# Patient Record
Sex: Female | Born: 1970 | Race: White | Hispanic: No | Marital: Married | Smoking: Never smoker
Health system: Southern US, Community
[De-identification: ages and names within clinical notes are randomized; demographics above are authoritative.]

## PROBLEM LIST (undated history)

## (undated) DIAGNOSIS — C439 Malignant melanoma of skin, unspecified: Secondary | ICD-10-CM

## (undated) DIAGNOSIS — G43909 Migraine, unspecified, not intractable, without status migrainosus: Secondary | ICD-10-CM

## (undated) DIAGNOSIS — F32A Depression, unspecified: Secondary | ICD-10-CM

---

## 1999-07-05 ENCOUNTER — Other Ambulatory Visit: Admission: RE | Admit: 1999-07-05 | Discharge: 1999-07-05 | Payer: Self-pay | Admitting: Internal Medicine

## 2000-03-29 ENCOUNTER — Inpatient Hospital Stay (HOSPITAL_COMMUNITY): Admission: AD | Admit: 2000-03-29 | Discharge: 2000-04-01 | Payer: Self-pay | Admitting: Obstetrics & Gynecology

## 2000-04-02 ENCOUNTER — Encounter: Admission: RE | Admit: 2000-04-02 | Discharge: 2000-05-09 | Payer: Self-pay | Admitting: Obstetrics and Gynecology

## 2000-05-03 ENCOUNTER — Other Ambulatory Visit: Admission: RE | Admit: 2000-05-03 | Discharge: 2000-05-03 | Payer: Self-pay | Admitting: Obstetrics & Gynecology

## 2001-01-02 ENCOUNTER — Emergency Department (HOSPITAL_COMMUNITY): Admission: EM | Admit: 2001-01-02 | Discharge: 2001-01-02 | Payer: Self-pay | Admitting: Emergency Medicine

## 2001-01-02 ENCOUNTER — Encounter: Payer: Self-pay | Admitting: Emergency Medicine

## 2001-07-13 ENCOUNTER — Inpatient Hospital Stay (HOSPITAL_COMMUNITY): Admission: AD | Admit: 2001-07-13 | Discharge: 2001-07-15 | Payer: Self-pay | Admitting: Obstetrics and Gynecology

## 2001-08-09 ENCOUNTER — Other Ambulatory Visit: Admission: RE | Admit: 2001-08-09 | Discharge: 2001-08-09 | Payer: Self-pay | Admitting: Obstetrics and Gynecology

## 2002-08-13 ENCOUNTER — Ambulatory Visit (HOSPITAL_COMMUNITY): Admission: RE | Admit: 2002-08-13 | Discharge: 2002-08-13 | Payer: Self-pay | Admitting: Obstetrics and Gynecology

## 2002-09-11 ENCOUNTER — Other Ambulatory Visit: Admission: RE | Admit: 2002-09-11 | Discharge: 2002-09-11 | Payer: Self-pay | Admitting: Obstetrics and Gynecology

## 2003-09-18 ENCOUNTER — Other Ambulatory Visit: Admission: RE | Admit: 2003-09-18 | Discharge: 2003-09-18 | Payer: Self-pay | Admitting: Obstetrics and Gynecology

## 2004-09-20 ENCOUNTER — Other Ambulatory Visit: Admission: RE | Admit: 2004-09-20 | Discharge: 2004-09-20 | Payer: Self-pay | Admitting: Obstetrics and Gynecology

## 2004-09-22 ENCOUNTER — Encounter: Admission: RE | Admit: 2004-09-22 | Discharge: 2004-09-22 | Payer: Self-pay | Admitting: Obstetrics and Gynecology

## 2005-11-09 ENCOUNTER — Other Ambulatory Visit: Admission: RE | Admit: 2005-11-09 | Discharge: 2005-11-09 | Payer: Self-pay | Admitting: Obstetrics and Gynecology

## 2006-07-06 ENCOUNTER — Encounter: Admission: RE | Admit: 2006-07-06 | Discharge: 2006-07-06 | Payer: Self-pay | Admitting: Obstetrics and Gynecology

## 2007-06-23 IMAGING — MG MM DIAGNOSTIC BILATERAL
5 series · 5 of 5 positions shown · non-contrast
Comparison: 07/11/04.

DG DIAGNOSTIC BILATERAL
Bilateral CC and MLO view(s) were taken.

RIGHT BREAST ULTRASOUND
Technologist: [employer], Medical
DIGITAL BILATERAL DIAGNOSTIC MAMMOGRAM AND RIGHT BREAST ULTRASOUND:
CLINICAL DATA: Palpable tender nodule in the right upper outer quadrant.

[R CC]
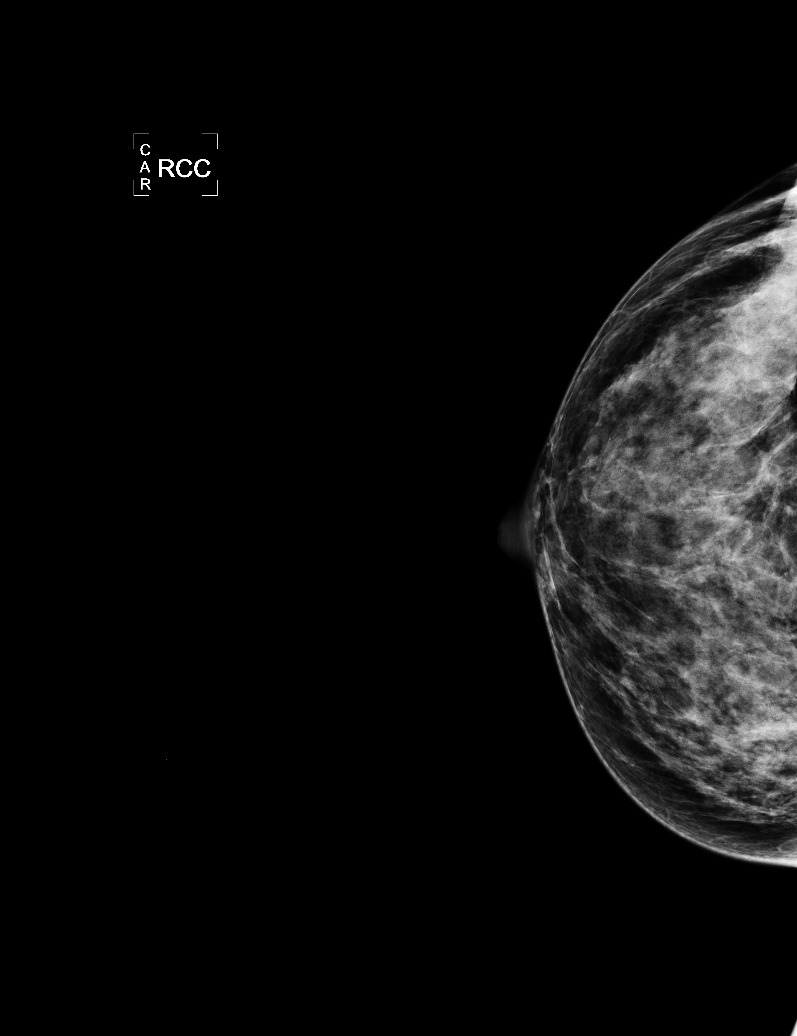

[L CC]
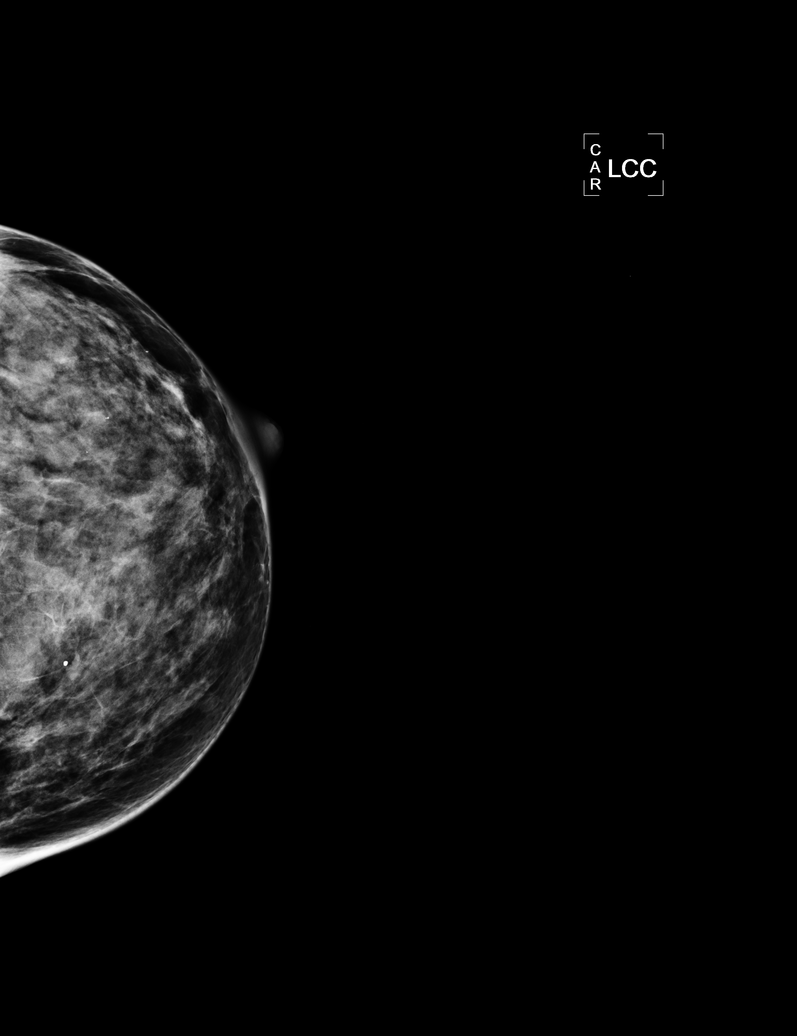

[L MLO]
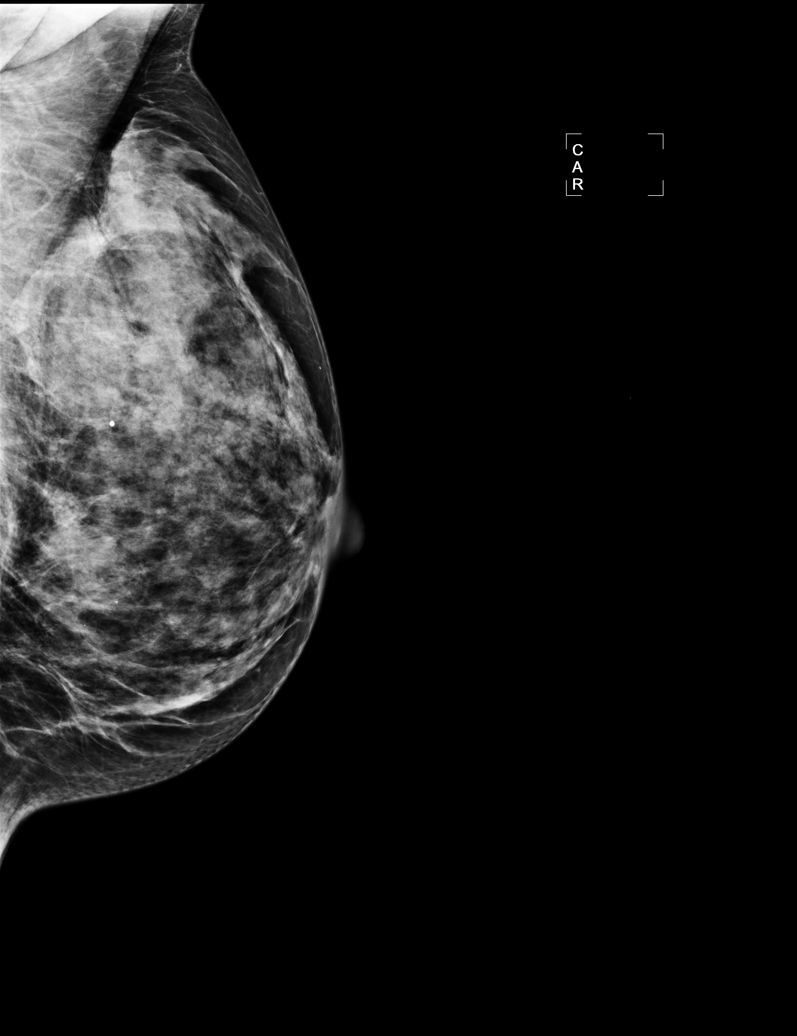

[R MLO]
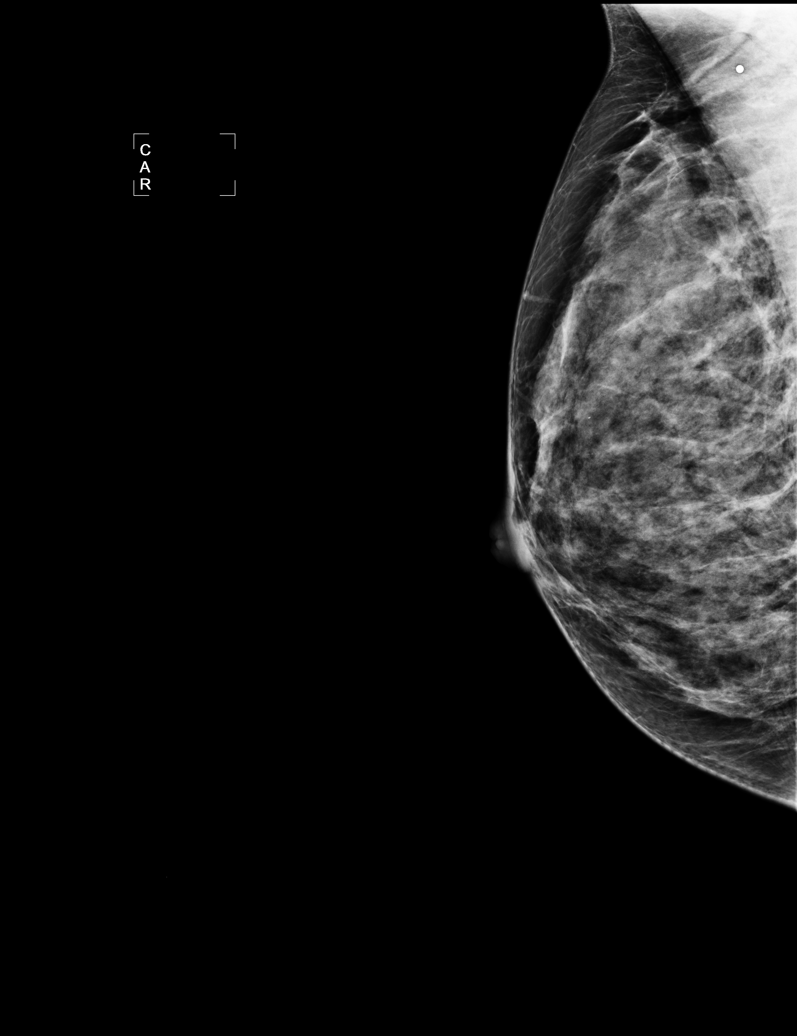

[R TAN]
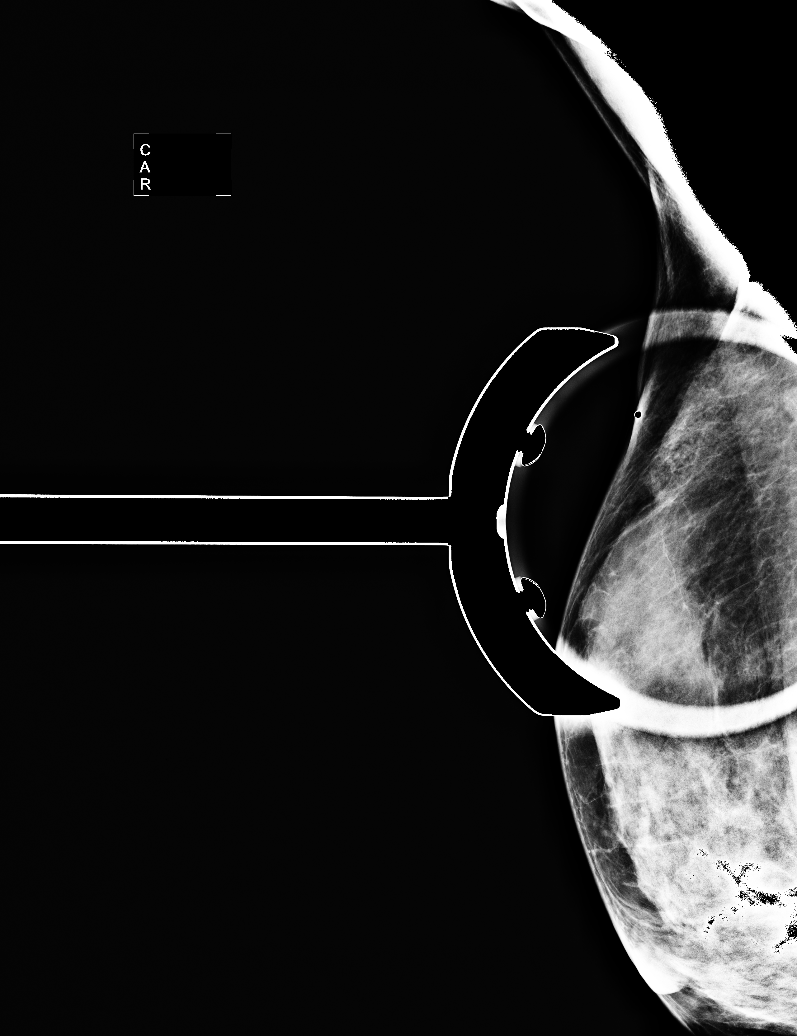

[5 of 5 positions shown; findings below may reference images not displayed]

The dense fibroglandular tissue is symmetric and stable.  No dominant masses, suspicious 
calcifications, or areas of architectural distortion are seen bilaterally.

Ultrasound of the right upper outer quadrant near the axilla in the patient's region of palpable 
concern and tenderness reveals normal intramammary lymph nodes only.  No suspicious solid masses, 
shadowing, or adenopathy are seen.
IMPRESSION: Normal intramammary lymph nodes in the right upper outer quadrant.  There is no specific 
radiographic evidence of malignancy bilaterally.  Screening mammography at age 40 is recommended, 
unless otherwise clinically indicated.

ASSESSMENT: Benign - BI-RADS 2

Routine screening mammogram at age 39.
,

## 2010-10-31 ENCOUNTER — Encounter: Payer: Self-pay | Admitting: Obstetrics and Gynecology

## 2011-06-15 ENCOUNTER — Other Ambulatory Visit: Payer: Self-pay | Admitting: Obstetrics and Gynecology

## 2011-08-16 ENCOUNTER — Ambulatory Visit: Admit: 2011-08-16 | Payer: Self-pay | Admitting: Obstetrics and Gynecology

## 2011-08-16 SURGERY — NOVASURE ABLATION
Anesthesia: Choice

## 2022-09-07 ENCOUNTER — Encounter (HOSPITAL_BASED_OUTPATIENT_CLINIC_OR_DEPARTMENT_OTHER): Payer: Self-pay | Admitting: Urology

## 2022-09-07 ENCOUNTER — Emergency Department (HOSPITAL_BASED_OUTPATIENT_CLINIC_OR_DEPARTMENT_OTHER): Payer: 59

## 2022-09-07 ENCOUNTER — Emergency Department (HOSPITAL_BASED_OUTPATIENT_CLINIC_OR_DEPARTMENT_OTHER)
Admission: EM | Admit: 2022-09-07 | Discharge: 2022-09-08 | Disposition: A | Discharge status: Home / Self Care | Payer: 59 | Attending: Emergency Medicine | Admitting: Emergency Medicine

## 2022-09-07 ENCOUNTER — Other Ambulatory Visit: Payer: Self-pay

## 2022-09-07 DIAGNOSIS — I4510 Unspecified right bundle-branch block: Secondary | ICD-10-CM | POA: Diagnosis not present

## 2022-09-07 DIAGNOSIS — R079 Chest pain, unspecified: Secondary | ICD-10-CM | POA: Diagnosis not present

## 2022-09-07 DIAGNOSIS — Z85820 Personal history of malignant melanoma of skin: Secondary | ICD-10-CM | POA: Insufficient documentation

## 2022-09-07 DIAGNOSIS — R110 Nausea: Secondary | ICD-10-CM | POA: Insufficient documentation

## 2022-09-07 HISTORY — DX: Depression, unspecified: F32.A

## 2022-09-07 HISTORY — DX: Malignant melanoma of skin, unspecified: C43.9

## 2022-09-07 HISTORY — DX: Migraine, unspecified, not intractable, without status migrainosus: G43.909

## 2022-09-07 LAB — CBC
HCT: 39.7 % (ref 36.0–46.0)
Hemoglobin: 13.1 g/dL (ref 12.0–15.0)
MCH: 29.5 pg (ref 26.0–34.0)
MCHC: 33 g/dL (ref 30.0–36.0)
MCV: 89.4 fL (ref 80.0–100.0)
Platelets: 268 10*3/uL (ref 150–400)
RBC: 4.44 MIL/uL (ref 3.87–5.11)
RDW: 12 % (ref 11.5–15.5)
WBC: 5.7 10*3/uL (ref 4.0–10.5)
nRBC: 0 % (ref 0.0–0.2)

## 2022-09-07 LAB — BASIC METABOLIC PANEL
Anion gap: 9 (ref 5–15)
BUN: 14 mg/dL (ref 6–20)
CO2: 28 mmol/L (ref 22–32)
Calcium: 9.2 mg/dL (ref 8.9–10.3)
Chloride: 103 mmol/L (ref 98–111)
Creatinine, Ser: 0.74 mg/dL (ref 0.44–1.00)
GFR, Estimated: >60 mL/min (ref >60)
Glucose, Bld: 101 mg/dL — ABNORMAL HIGH (ref 70–99)
Potassium: 3.7 mmol/L (ref 3.5–5.1)
Sodium: 140 mmol/L (ref 135–145)

## 2022-09-07 LAB — TROPONIN I (HIGH SENSITIVITY): Troponin I (High Sensitivity): 2 ng/L (ref <18)

## 2022-09-07 NOTE — ED Provider Notes (Signed)
[facility] Provider Note: Georgena Spurling, MD, FACEP  CSN: [id number] MRN: [id number] ARRIVAL: 09/07/22 at 2138 ROOM: [facility]  Chest Pain   HISTORY OF PRESENT ILLNESS  09/07/22 10:55 PM Zoe Tate is a 51 y.o. female who was sitting at her computer about 8 PM when she felt the onset of chest pain.  There was a sharp component in the left upper chest as well as a pressure in her center upper chest.  Pain was moderate and worse with deep breathing.  She did feel somewhat short of breath and mildly nauseated but did not vomit and did not have diaphoresis.  She was also having some palpitations which she describes as fluttering.  The pain did not radiate to her arm or neck.  It pretty much resolved after about 15 minutes but left her with a bitemporal headache which she still has.  She also felt "not like myself" after the episode of chest pain but has improved since.  She has had no recent travel.  She does not smoke.  She has no history of hypertension or hyperlipidemia.  She is not on estrogen.   Past Medical History:  Diagnosis Date   Depression    Melanoma ([place])    Migraine     History reviewed. No pertinent surgical history.  History reviewed. No pertinent family history.  Social History   Tobacco Use   Smoking status: Never   Smokeless tobacco: Never  Substance Use Topics   Alcohol use: Yes    Comment: occ   Drug use: Never    Prior to Admission medications   Not on File    Allergies Patient has no known allergies.   REVIEW OF SYSTEMS  Negative except as noted here or in the History of Present Illness.   PHYSICAL EXAMINATION  Initial Vital Signs Blood pressure (!) 158/102, pulse 74, temperature 97.6 F (36.4 C), temperature source Oral, resp. rate 18, height '5\' 7"'$  (1.702 m), weight 65.3 kg, SpO2 100 %.  Examination General: Well-developed, well-nourished female in no acute distress; appearance consistent with age of  record HENT: normocephalic; atraumatic Eyes: Normal appearance Neck: supple Heart: regular rate and rhythm; rare PVCs Lungs: clear to auscultation bilaterally Chest: Nontender Abdomen: soft; nondistended; nontender; bowel sounds present Extremities: No deformity; full range of motion; pulses normal; no edema Neurologic: Awake, alert and oriented; motor function intact in all extremities and symmetric; no facial droop Skin: Warm and dry Psychiatric: Normal mood and affect   RESULTS  Summary of this visit's results, reviewed and interpreted by myself:   EKG Interpretation  Date/Time:  Wednesday September 07 2022 21:50:46 EST Ventricular Rate:  86 PR Interval:  132 QRS Duration: 100 QT Interval:  378 QTC Calculation: 452 R Axis:   82 Text Interpretation: Normal sinus rhythm Incomplete right bundle branch block Borderline ECG No previous ECGs available Confirmed by Crissa Sowder, Jenny Reichmann [phone]) on 09/07/2022 10:45:02 PM       Laboratory Studies: Results for orders placed or performed during the hospital encounter of 09/07/22 (from the past 24 hour(s))  Basic metabolic panel     Status: Abnormal   Collection Time: 09/07/22  9:54 PM  Result Value Ref Range   Sodium 140 135 - 145 mmol/L   Potassium 3.7 3.5 - 5.1 mmol/L   Chloride 103 98 - 111 mmol/L   CO2 28 22 - 32 mmol/L   Glucose, Bld 101 (H) 70 - 99 mg/dL   BUN 14  6 - 20 mg/dL   Creatinine, Ser 0.74 0.44 - 1.00 mg/dL   Calcium 9.2 8.9 - 10.3 mg/dL   GFR, Estimated >60 >60 mL/min   Anion gap 9 5 - 15  CBC     Status: None   Collection Time: 09/07/22  9:54 PM  Result Value Ref Range   WBC 5.7 4.0 - 10.5 K/uL   RBC 4.44 3.87 - 5.11 MIL/uL   Hemoglobin 13.1 12.0 - 15.0 g/dL   HCT 39.7 36.0 - 46.0 %   MCV 89.4 80.0 - 100.0 fL   MCH 29.5 26.0 - 34.0 pg   MCHC 33.0 30.0 - 36.0 g/dL   RDW 12.0 11.5 - 15.5 %   Platelets 268 150 - 400 K/uL   nRBC 0.0 0.0 - 0.2 %  Troponin I (High Sensitivity)     Status: None   Collection Time:  09/07/22  9:54 PM  Result Value Ref Range   Troponin I (High Sensitivity) 2 <18 ng/L   Imaging Studies: DG Chest 2 View  Result Date: 09/07/2022 CLINICAL DATA:  Chest pain EXAM: CHEST - 2 VIEW COMPARISON:  None Available. FINDINGS: The heart size and mediastinal contours are within normal limits. Both lungs are clear. The visualized skeletal structures are unremarkable. IMPRESSION: No active cardiopulmonary disease. Electronically Signed   By: Placido Sou M.D.   On: 09/07/2022 22:11    ED COURSE and MDM  Nursing notes, initial and subsequent vitals signs, including pulse oximetry, reviewed and interpreted by myself.  Vitals:   09/07/22 2144 09/07/22 2144  BP:  (!) 158/102  Pulse:  74  Resp:  18  Temp:  97.6 F (36.4 C)  TempSrc:  Oral  SpO2:  100%  Weight: 65.3 kg   Height: '5\' 7"'$  (1.702 m)    Medications - No data to display    PROCEDURES  Procedures   ED DIAGNOSES  No diagnosis found.

## 2022-09-07 NOTE — ED Notes (Signed)
Pt provided water, per her request.  EDP Molpus aware and agreeable.

## 2022-09-07 NOTE — ED Triage Notes (Signed)
Pt reports central chest pain that started approx 1.5 hr PTA  Denies any N/V, mild SOB  No radiation of pain  No previous cardiac history

## 2022-09-08 LAB — TROPONIN I (HIGH SENSITIVITY): Troponin I (High Sensitivity): 2 ng/L (ref <18)

## 2022-09-08 NOTE — ED Notes (Signed)
D/c paperwork reviewed with pt, including f/u care. Pt verbalized understanding, no questions or concerns at time of d/c. Ambulatory to ED exit without assistance, NAD.

## 2022-10-13 ENCOUNTER — Encounter (HOSPITAL_BASED_OUTPATIENT_CLINIC_OR_DEPARTMENT_OTHER): Payer: Self-pay | Admitting: Cardiology

## 2022-10-13 ENCOUNTER — Ambulatory Visit (HOSPITAL_BASED_OUTPATIENT_CLINIC_OR_DEPARTMENT_OTHER): Payer: 59 | Admitting: Cardiology

## 2022-10-13 VITALS — BP 126/90 | HR 82 | Ht 66.5 in | Wt 147.8 lb

## 2022-10-13 DIAGNOSIS — R0789 Other chest pain: Secondary | ICD-10-CM | POA: Diagnosis not present

## 2022-10-13 DIAGNOSIS — Z7189 Other specified counseling: Secondary | ICD-10-CM | POA: Diagnosis not present

## 2022-10-13 DIAGNOSIS — Z712 Person consulting for explanation of examination or test findings: Secondary | ICD-10-CM

## 2022-10-13 DIAGNOSIS — R079 Chest pain, unspecified: Secondary | ICD-10-CM

## 2022-10-13 NOTE — Patient Instructions (Signed)
Medication Instructions:  Your physician recommends that you continue on your current medications as directed. Please refer to the Current Medication list given to you today.   *If you need a refill on your cardiac medications before your next appointment, please call your pharmacy*  Lab Work: NONE  Testing/Procedures: NONE  Follow-Up: AS NEEDED

## 2022-10-13 NOTE — Progress Notes (Signed)
Cardiology Office Note:    Date:  10/13/2022   ID:  Zoe Tate, DOB 1971-09-11, MRN [id number]  PCP:  Robyne Peers, MD  Cardiologist:  Buford Dresser, MD  Referring MD: Shanon Rosser, MD   CC: New patient evaluation for chest pain   History of Present Illness:    Zoe Tate is a 52 y.o. female with a hx of melanoma, and depression, who is seen as a new consult at the request of Molpus, John, MD for the evaluation and management of chest pain.  On 09/07/2022 she presented to the ED following a 15 minute episode of sharp chest pain with fluttering palpitations while sitting at her computer. Subsequently developed a bitemporal headache. No arrhythmias or ectopy seen on heart monitor. She was discharged in stable condition and referred for outpatient cardiology follow-up.  Chest pain: -Initial onset: 09/07/22.  -Quality: Pressure like someone sitting on her chest, with shooting pains. -Frequency: Isolated episode, has not recurred. -Duration: About 15 minutes. -Associated symptoms: While sitting and typing, her apple watch frequently alerts her to fast heart rates, including over 100 bpm. At this point, she denies feeling limited by her symptoms. -Aggravating/alleviating factors: She notes that she has had life stressors; she lost her husband last January. -Prior cardiac history:  At one time she was told that she had a small heart murmur. -Prior ECG: On 09/07/22 ECG showed NSR at 86 bpm, Incomplete RBBB. -Prior workup: None -Prior treatment: None -Alcohol:  Always has been a social drinker, weekends and holidays. After her husband passed, she stopped drinking for about 6 months. Currently, on most days she will have a small espresso martini. If socially, usually not more than 2 drinks. -Tobacco: Never. -Comorbidities:  -Exercise level: For work she sits at a desk while working from home. She will try to get up for breaks periodically, and use workout equipment at  home. She walks daily with her dog. She is following the mediterranean diet and has lost some weight.  -Cardiac ROS: no shortness of breath, no PND, no orthopnea, no LE edema, no syncope -Family history:  She believes her biological father died of a heart attack.   Past Medical History:  Diagnosis Date   Depression    Melanoma ([place])    Migraine     No past surgical history on file.  Current Medications: Current Outpatient Medications on File Prior to Visit  Medication Sig   escitalopram (LEXAPRO) 20 MG tablet Take 20 mg by mouth daily.   imipramine (TOFRANIL) 25 MG tablet Take 75 mg by mouth at bedtime.   No current facility-administered medications on file prior to visit.     Allergies:   Patient has no known allergies.   Social History   Tobacco Use   Smoking status: Never   Smokeless tobacco: Never  Substance Use Topics   Alcohol use: Yes    Comment: occ   Drug use: Never    Family History: She believes her biological father died of a heart attack.  ROS:   Please see the history of present illness.  Additional pertinent ROS: Constitutional: Negative for chills, fever, unintentional weight loss. Positive for night sweats.  HENT: Negative for ear pain and hearing loss.   Eyes: Negative for loss of vision and eye pain.  Respiratory: Negative for cough, sputum, wheezing.   Cardiovascular: See HPI. Gastrointestinal: Negative for abdominal pain, melena, and hematochezia.  Genitourinary: Negative for dysuria and hematuria.  Musculoskeletal: Negative for falls and myalgias.  Skin: Negative for itching and rash.  Neurological: Negative for focal weakness, focal sensory changes and loss of consciousness.  Endo/Heme/Allergies: Does not bruise/bleed easily.     EKGs/Labs/Other Studies Reviewed:    The following studies were reviewed today:  Chest X-ray  09/07/2022: FINDINGS: The heart size and mediastinal contours are within normal limits. Both lungs are clear. The  visualized skeletal structures are unremarkable.   IMPRESSION: No active cardiopulmonary disease.   EKG:  EKG is personally reviewed.   10/13/2022:  NSR at 82 bpm, iRBBB  Recent Labs: 09/07/2022: BUN 14; Creatinine, Ser 0.74; Hemoglobin 13.1; Platelets 268; Potassium 3.7; Sodium 140   Recent Lipid Panel No results found for: "CHOL", "TRIG", "HDL", "CHOLHDL", "VLDL", "LDLCALC", "LDLDIRECT"  Physical Exam:    VS:  BP (!) 126/90 (BP Location: Right Arm, Patient Position: Sitting, Cuff Size: Normal)   Pulse 82   Ht 5' 6.5" (1.689 m)   Wt 147 lb 12.8 oz (67 kg)   BMI 23.50 kg/m     Wt Readings from Last 3 Encounters:  10/13/22 147 lb 12.8 oz (67 kg)  09/07/22 144 lb (65.3 kg)    GEN: Well nourished, well developed in no acute distress HEENT: Normal, moist mucous membranes NECK: No JVD CARDIAC: regular rhythm, normal S1 and S2, no rubs or gallops. No murmur. VASCULAR: Radial and DP pulses 2+ bilaterally. No carotid bruits RESPIRATORY:  Clear to auscultation without rales, wheezing or rhonchi  ABDOMEN: Soft, non-tender, non-distended MUSCULOSKELETAL:  Ambulates independently SKIN: Warm and dry, no edema NEUROLOGIC:  Alert and oriented x 3. No focal neuro deficits noted. PSYCHIATRIC:  Normal affect    ASSESSMENT:    1. Other chest pain   2. Cardiac risk counseling   3. Counseling on health promotion and disease prevention   4. Encounter to discuss test results    PLAN:    Chest pain -reviewed her ER test results at length, reassuring -has only had the single episode as noted -reviewed multiple possible etiologies  -reviewed red flag warning signs that need immediate medical attention -if she has intermittent episodes that are mild/minor, she will contact me and we will discuss additional testing  Cardiac risk counseling and prevention recommendations: -recommend heart healthy/Mediterranean diet, with whole grains, fruits, vegetable, fish, lean meats, nuts, and olive  oil. Limit salt. -recommend moderate walking, 3-5 times/week for 30-50 minutes each session. Aim for at least 150 minutes.week. Goal should be pace of 3 miles/hours, or walking 1.5 miles in 30 minutes -recommend avoidance of tobacco products. Avoid excess alcohol. -ASCVD risk score: The 10-year ASCVD risk score (Arnett DK, et al., 2019) is: 1.8%   Values used to calculate the score:     Age: 41 years     Sex: Female     Is Non-Hispanic African American: No     Diabetic: No     Tobacco smoker: No     Systolic Blood Pressure: 123XX123 mmHg     Is BP treated: No     HDL Cholesterol: 45 MG/DL     Total Cholesterol: 198 MG/DL    Plan for follow up: As needed.  Buford Dresser, MD, PhD, [facility]    Medication Adjustments/Labs and Tests Ordered: Current medicines are reviewed at length with the patient today.  Concerns regarding medicines are outlined above.   Orders Placed This Encounter  Procedures   EKG 12-Lead   No orders of the defined types were placed in this encounter.  Patient Instructions  Medication Instructions:  Your physician recommends that you continue on your current medications as directed. Please refer to the Current Medication list given to you today.   *If you need a refill on your cardiac medications before your next appointment, please call your pharmacy*  Lab Work: NONE  Testing/Procedures: NONE  Follow-Up: AS NEEDED      I,Mathew Stumpf,acting as a Education administrator for [employer], MD.,have documented all relevant documentation on the behalf of Buford Dresser, MD,as directed by  Buford Dresser, MD while in the presence of Buford Dresser, MD.  I, Buford Dresser, MD, have reviewed all documentation for this visit. The documentation on 11/27/22 for the exam, diagnosis, procedures, and orders are all accurate and complete.   Signed, Buford Dresser, MD PhD 10/13/2022     [facility]

## 2022-11-27 ENCOUNTER — Encounter (HOSPITAL_BASED_OUTPATIENT_CLINIC_OR_DEPARTMENT_OTHER): Payer: Self-pay | Admitting: Cardiology

## 2023-07-23 ENCOUNTER — Encounter (HOSPITAL_BASED_OUTPATIENT_CLINIC_OR_DEPARTMENT_OTHER): Payer: Self-pay | Admitting: Emergency Medicine

## 2023-07-23 ENCOUNTER — Emergency Department (HOSPITAL_BASED_OUTPATIENT_CLINIC_OR_DEPARTMENT_OTHER): Payer: No Typology Code available for payment source

## 2023-07-23 ENCOUNTER — Other Ambulatory Visit: Payer: Self-pay

## 2023-07-23 ENCOUNTER — Emergency Department (HOSPITAL_BASED_OUTPATIENT_CLINIC_OR_DEPARTMENT_OTHER)
Admission: EM | Admit: 2023-07-23 | Discharge: 2023-07-23 | Disposition: A | Discharge status: Home / Self Care | Payer: No Typology Code available for payment source | Attending: Emergency Medicine | Admitting: Emergency Medicine

## 2023-07-23 DIAGNOSIS — Y92410 Unspecified street and highway as the place of occurrence of the external cause: Secondary | ICD-10-CM | POA: Diagnosis not present

## 2023-07-23 DIAGNOSIS — G44209 Tension-type headache, unspecified, not intractable: Secondary | ICD-10-CM | POA: Diagnosis not present

## 2023-07-23 DIAGNOSIS — T148XXA Other injury of unspecified body region, initial encounter: Secondary | ICD-10-CM

## 2023-07-23 DIAGNOSIS — S161XXA Strain of muscle, fascia and tendon at neck level, initial encounter: Secondary | ICD-10-CM | POA: Diagnosis not present

## 2023-07-23 DIAGNOSIS — M542 Cervicalgia: Secondary | ICD-10-CM | POA: Diagnosis present

## 2023-07-23 MED ORDER — CYCLOBENZAPRINE HCL 10 MG PO TABS: 5.0000 mg | ORAL_TABLET | Freq: Every day | ORAL | 0 refills | Status: AC

## 2023-07-23 MED ORDER — ACETAMINOPHEN 500 MG PO TABS
1000.0000 mg | ORAL_TABLET | Freq: Once | ORAL | Status: AC
  Administered 2023-07-23: 1000 mg via ORAL
  Filled 2023-07-23: qty 2
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
## 2023-12-08 ENCOUNTER — Ambulatory Visit: Payer: Self-pay | Admitting: Family Medicine

## 2023-12-08 NOTE — Telephone Encounter (Signed)
 Summary: Headache   Copied From CRM [id number]. Reason for Triage: Patient complains of Headache at 5/10.

## 2023-12-08 NOTE — Telephone Encounter (Signed)
 FYI

## 2023-12-08 NOTE — Telephone Encounter (Signed)
 Pt could not be reached with phone number listed on chart: will route to clinic as fyi

## 2023-12-12 ENCOUNTER — Other Ambulatory Visit: Payer: Self-pay | Admitting: Family Medicine

## 2023-12-12 NOTE — Telephone Encounter (Signed)
 Copied from CRM [id number]. Topic: Clinical - Medication Refill >> Dec 12, 2023  5:45 PM DeAngela L wrote: Most Recent Primary Care Visit:   Medication: PT doesn't have the of the medication bottles on hand but requesting if the Dr can give her a refill for her cluster headaches  Medications named are  Prednisone  Nurtec  QULIPTA  and would like to know if the Dr can prescribe medication for the cluster headaches before she leaves to go out of town on 12/16/2023  Has the patient contacted their pharmacy? yes (Agent: If no, request that the patient contact the pharmacy for the refill. If patient does not wish to contact the pharmacy document the reason why and proceed with request.) (Agent: If yes, when and what did the pharmacy advise?)  Is this the correct pharmacy for this prescription? Yes If no, delete pharmacy and type the correct one.  This is the patient's preferred pharmacy:   [facility] [place], [place] - [address] [place] [address] Phone: [phone] Fax: [phone]   Has the prescription been filled recently? No  Is the patient out of the medication? Yes  Has the patient been seen for an appointment in the last year OR does the patient have an upcoming appointment? yes  Can we respond through MyChart? Yes  Agent: Please be advised that Rx refills may take up to 3 business days. We ask that you follow-up with your pharmacy.

## 2023-12-13 ENCOUNTER — Encounter: Payer: Self-pay | Admitting: Family Medicine

## 2023-12-13 ENCOUNTER — Ambulatory Visit: Payer: Self-pay | Admitting: Family Medicine

## 2023-12-13 NOTE — Telephone Encounter (Signed)
 Medication refill requires nurse triage, not a medication refill for medications patient is regularly on. Please see nurse triage for 12/13/23.  Copied from CRM [id number]. Topic: Clinical - Medication Refill >> Dec 12, 2023  6:17 PM DeAngela L wrote: Most Recent Primary Care Visit:  Copied from CRM [id number]. Topic: Clinical - Medication Refill >> Dec 12, 2023  5:45 PM DeAngela L wrote: Most Recent Primary Care Visit:    Medication: PT doesn't have the of the medication bottles on hand but requesting if the Dr can give her a refill for her cluster headaches  Medications named are  Prednisone  Nurtec  QULIPTA  and would like to know if the Dr can prescribe medication for the cluster headaches before she leaves to go out of town on 12/16/2023   Has the patient contacted their pharmacy? yes (Agent: If no, request that the patient contact the pharmacy for the refill. If patient does not wish to contact the pharmacy document the reason why and proceed with request.) (Agent: If yes, when and what did the pharmacy advise?)   Is this the correct pharmacy for this prescription? Yes If no, delete pharmacy and type the correct one.  This is the patient's preferred pharmacy:    [facility] [place], [place] - [address] [place] [address] Phone: [phone] Fax: [phone]     Has the prescription been filled recently? No   Is the patient out of the medication? Yes   Has the patient been seen for an appointment in the last year OR does the patient have an upcoming appointment? yes   Can we respond through MyChart? Yes   Agent: Please be advised that Rx refills may take up to 3 business days. We ask that you follow-up with your pharmacy. This encounter was created in error - please disregard.

## 2023-12-13 NOTE — Telephone Encounter (Signed)
 1st attempt, "call cannot be completed as dialed" error message.   Copied from CRM [id number]. Topic: Clinical - Medication Refill >> Dec 12, 2023  6:17 PM DeAngela L wrote: Most Recent Primary Care Visit:  Copied from CRM [id number]. Topic: Clinical - Medication Refill >> Dec 12, 2023  5:45 PM DeAngela L wrote: Most Recent Primary Care Visit:    Medication: PT doesn't have the of the medication bottles on hand but requesting if the Dr can give her a refill for her cluster headaches  Medications named are  Prednisone  Nurtec  QULIPTA  and would like to know if the Dr can prescribe medication for the cluster headaches before she leaves to go out of town on 12/16/2023   Has the patient contacted their pharmacy? yes (Agent: If no, request that the patient contact the pharmacy for the refill. If patient does not wish to contact the pharmacy document the reason why and proceed with request.) (Agent: If yes, when and what did the pharmacy advise?)   Is this the correct pharmacy for this prescription? Yes If no, delete pharmacy and type the correct one.  This is the patient's preferred pharmacy:    [facility] [place], [place] - [address] [place] [address] Phone: [phone] Fax: [phone]     Has the prescription been filled recently? No   Is the patient out of the medication? Yes   Has the patient been seen for an appointment in the last year OR does the patient have an upcoming appointment? yes   Can we respond through MyChart? Yes   Agent: Please be advised that Rx refills may take up to 3 business days. We ask that you follow-up with your pharmacy.

## 2024-04-02 ENCOUNTER — Ambulatory Visit: Payer: No Typology Code available for payment source | Admitting: Family Medicine
